# Patient Record
Sex: Male | Born: 1965 | Race: White | Hispanic: No | Marital: Single | Smoking: Current every day smoker
Health system: Southern US, Community
[De-identification: ages and names within clinical notes are randomized; demographics above are authoritative.]

---

## 2018-07-17 ENCOUNTER — Other Ambulatory Visit: Payer: Self-pay

## 2018-07-17 ENCOUNTER — Emergency Department (HOSPITAL_COMMUNITY)
Admission: EM | Admit: 2018-07-17 | Discharge: 2018-07-18 | Disposition: A | Discharge status: Home / Self Care | Payer: Self-pay | Attending: Emergency Medicine | Admitting: Emergency Medicine

## 2018-07-17 DIAGNOSIS — L03115 Cellulitis of right lower limb: Secondary | ICD-10-CM | POA: Insufficient documentation
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
## 2018-07-18 ENCOUNTER — Emergency Department (HOSPITAL_COMMUNITY): Payer: Self-pay

## 2018-07-18 MED ORDER — CEPHALEXIN 250 MG PO CAPS
500.0000 mg | ORAL_CAPSULE | Freq: Once | ORAL | Status: AC
  Administered 2018-07-18: 02:00:00 500 mg via ORAL
  Filled 2018-07-18: qty 2

## 2018-07-18 MED ORDER — CEPHALEXIN 500 MG PO CAPS: 500.0000 mg | ORAL_CAPSULE | Freq: Four times a day (QID) | ORAL | 0 refills | Status: AC

## 2018-07-18 NOTE — ED Notes (Signed)
Patient verbalizes understanding of discharge instructions. Opportunity for questioning and answers were provided. Armband removed by staff, pt discharged from ED.  

## 2018-07-18 NOTE — Discharge Instructions (Signed)
Take the antibiotic as prescribed. Ibuprofen and/or Tylenol for pain as needed.  If redness and pain do not improve after 3 days of antibiotics, return here for a recheck. If symptoms worsen - high fever, severe pain, redness becomes more widespread - return to the emergency department for further evaluation.
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
## 2019-08-15 ENCOUNTER — Encounter (HOSPITAL_COMMUNITY): Payer: Self-pay

## 2019-08-15 ENCOUNTER — Emergency Department (HOSPITAL_COMMUNITY)
Admission: EM | Admit: 2019-08-15 | Discharge: 2019-08-15 | Disposition: A | Discharge status: Home / Self Care | Payer: Self-pay | Attending: Emergency Medicine | Admitting: Emergency Medicine

## 2019-08-15 ENCOUNTER — Other Ambulatory Visit: Payer: Self-pay

## 2019-08-15 DIAGNOSIS — L237 Allergic contact dermatitis due to plants, except food: Secondary | ICD-10-CM | POA: Insufficient documentation

## 2019-08-15 DIAGNOSIS — F17210 Nicotine dependence, cigarettes, uncomplicated: Secondary | ICD-10-CM | POA: Insufficient documentation

## 2019-08-15 MED ORDER — DEXAMETHASONE SODIUM PHOSPHATE 10 MG/ML IJ SOLN
10.0000 mg | Freq: Once | INTRAMUSCULAR | Status: AC
  Administered 2019-08-15: 10 mg via INTRAMUSCULAR
  Filled 2019-08-15: qty 1

## 2019-08-15 MED ORDER — METHYLPREDNISOLONE 4 MG PO TBPK: ORAL_TABLET | ORAL | 0 refills | Status: AC

## 2019-08-15 NOTE — ED Notes (Signed)
Pt called 3 times no answer.

## 2019-08-15 NOTE — ED Triage Notes (Signed)
Pt arrives POV for eval of poison ivy/poison oak onset Saturday. States tried OTC cream for face and arm rash, as well as rash in groin but does not seem to be improving.

## 2019-08-15 NOTE — ED Notes (Signed)
Patient verbalizes understanding of discharge instructions. Opportunity for questioning and answers were provided. Armband removed by staff, pt discharged from ED to home 
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
## 2019-08-15 NOTE — Discharge Instructions (Addendum)
Please read instructions below. Take 25mg  of benadryl every 6 hours for rash and itching. You can take a daytime allergy medicine, such as claritin or zyrtec instead of benadryl. Starting tomorrow, take the medrol dosepak as directed. You can apply a hydrocortisone cream to your rash. Avoid putting this near your eyes. Schedule an appointment with your PCP to follow up on your visit today. Return to the ER immediately for feeling your throat closing, swelling of your lips or tongue, difficulty breathing, or new or concerning symptoms.

## 2020-06-21 ENCOUNTER — Encounter (HOSPITAL_COMMUNITY): Payer: Self-pay

## 2020-06-21 ENCOUNTER — Other Ambulatory Visit: Payer: Self-pay

## 2020-06-21 ENCOUNTER — Emergency Department (HOSPITAL_COMMUNITY): Payer: Self-pay

## 2020-06-21 ENCOUNTER — Emergency Department (HOSPITAL_COMMUNITY)
Admission: EM | Admit: 2020-06-21 | Discharge: 2020-06-22 | Disposition: A | Discharge status: Home / Self Care | Payer: Self-pay | Attending: Emergency Medicine | Admitting: Emergency Medicine

## 2020-06-21 DIAGNOSIS — F17200 Nicotine dependence, unspecified, uncomplicated: Secondary | ICD-10-CM | POA: Insufficient documentation

## 2020-06-21 DIAGNOSIS — R202 Paresthesia of skin: Secondary | ICD-10-CM | POA: Insufficient documentation

## 2020-06-21 DIAGNOSIS — R413 Other amnesia: Secondary | ICD-10-CM | POA: Insufficient documentation

## 2020-06-21 DIAGNOSIS — R030 Elevated blood-pressure reading, without diagnosis of hypertension: Secondary | ICD-10-CM

## 2020-06-21 DIAGNOSIS — E876 Hypokalemia: Secondary | ICD-10-CM

## 2020-06-21 LAB — CBC
HCT: 47.6 % (ref 39.0–52.0)
Hemoglobin: 15.9 g/dL (ref 13.0–17.0)
MCH: 30.6 pg (ref 26.0–34.0)
MCHC: 33.4 g/dL (ref 30.0–36.0)
MCV: 91.5 fL (ref 80.0–100.0)
Platelets: 327 10*3/uL (ref 150–400)
RBC: 5.2 MIL/uL (ref 4.22–5.81)
RDW: 12.9 % (ref 11.5–15.5)
WBC: 12.3 10*3/uL — ABNORMAL HIGH (ref 4.0–10.5)
nRBC: 0 % (ref 0.0–0.2)

## 2020-06-21 LAB — I-STAT CHEM 8, ED
BUN: 13 mg/dL (ref 6–20)
Calcium, Ion: 1.15 mmol/L (ref 1.15–1.40)
Chloride: 104 mmol/L (ref 98–111)
Creatinine, Ser: 0.9 mg/dL (ref 0.61–1.24)
Glucose, Bld: 81 mg/dL (ref 70–99)
HCT: 47 % (ref 39.0–52.0)
Hemoglobin: 16 g/dL (ref 13.0–17.0)
Potassium: 3.2 mmol/L — ABNORMAL LOW (ref 3.5–5.1)
Sodium: 140 mmol/L (ref 135–145)
TCO2: 26 mmol/L (ref 22–32)

## 2020-06-21 LAB — DIFFERENTIAL
Abs Immature Granulocytes: 0.06 10*3/uL (ref 0.00–0.07)
Basophils Absolute: 0.1 10*3/uL (ref 0.0–0.1)
Basophils Relative: 1 %
Eosinophils Absolute: 0.2 10*3/uL (ref 0.0–0.5)
Eosinophils Relative: 1 %
Immature Granulocytes: 1 %
Lymphocytes Relative: 17 %
Lymphs Abs: 2.1 10*3/uL (ref 0.7–4.0)
Monocytes Absolute: 0.9 10*3/uL (ref 0.1–1.0)
Monocytes Relative: 7 %
Neutro Abs: 9.1 10*3/uL — ABNORMAL HIGH (ref 1.7–7.7)
Neutrophils Relative %: 73 %

## 2020-06-21 LAB — ETHANOL: Alcohol, Ethyl (B): <10 mg/dL (ref <10)

## 2020-06-21 LAB — COMPREHENSIVE METABOLIC PANEL
ALT: 21 U/L (ref 0–44)
AST: 28 U/L (ref 15–41)
Albumin: 4.3 g/dL (ref 3.5–5.0)
Alkaline Phosphatase: 75 U/L (ref 38–126)
Anion gap: 10 (ref 5–15)
BUN: 12 mg/dL (ref 6–20)
CO2: 25 mmol/L (ref 22–32)
Calcium: 9.1 mg/dL (ref 8.9–10.3)
Chloride: 102 mmol/L (ref 98–111)
Creatinine, Ser: 0.99 mg/dL (ref 0.61–1.24)
GFR, Estimated: >60 mL/min (ref >60)
Glucose, Bld: 85 mg/dL (ref 70–99)
Potassium: 3.2 mmol/L — ABNORMAL LOW (ref 3.5–5.1)
Sodium: 137 mmol/L (ref 135–145)
Total Bilirubin: 0.8 mg/dL (ref 0.3–1.2)
Total Protein: 7 g/dL (ref 6.5–8.1)

## 2020-06-21 LAB — PROTIME-INR
INR: 1 (ref 0.8–1.2)
Prothrombin Time: 13.3 seconds (ref 11.4–15.2)

## 2020-06-21 LAB — APTT: aPTT: 29 seconds (ref 24–36)

## 2020-06-21 MED ORDER — POTASSIUM CHLORIDE CRYS ER 20 MEQ PO TBCR
40.0000 meq | EXTENDED_RELEASE_TABLET | Freq: Once | ORAL | Status: AC
  Administered 2020-06-22: 40 meq via ORAL
  Filled 2020-06-21: qty 2

## 2020-06-21 NOTE — ED Triage Notes (Signed)
Emergency Medicine Provider Triage Evaluation Note  Matthew Dunlap , a 55 y.o. male  was evaluated in triage.  Pt complains of memory loss, numbness  Review of Systems  Positive: As above Negative: Changes, facial droop, word slurring, headache  Physical Exam  BP (!) 171/109 (BP Location: Right Arm)   Pulse (!) 109   Temp 98.9 F (37.2 C) (Oral)   Resp 16   Ht 5\' 5"  (1.651 m)   Wt 72.6 kg   SpO2 100%   BMI 26.63 kg/m  Gen:   Awake, no distress   HEENT:  Atraumatic  Resp:  Normal effort  Cardiac:  Normal rate  Abd:   Nondistended, nontender  MSK:   Moves extremities without difficulty  Neuro:  Mental Status:  Alert, thought content appropriate, able to give a coherent history. Speech fluent without evidence of aphasia. Able to follow 2 step commands without difficulty.  Cranial Nerves:  II:  Peripheral visual fields grossly normal, pupils equal, round, reactive to light III,IV, VI: ptosis not present, extra-ocular motions intact bilaterally  V,VII: smile symmetric, facial light touch sensation equal VIII: hearing grossly normal to voice  X: uvula elevates symmetrically  XI: bilateral shoulder shrug symmetric and strong XII: midline tongue extension without fassiculations Motor:  Normal tone. 5/5 strength of BUE and BLE major muscle groups including strong and equal grip strength and dorsiflexion/plantar flexion. Sensory:  Reported numbness/tingling on the medial aspect of the left arm and leg. Normal in other extremities. Cerebellar: normal finger-to-nose with bilateral upper extremities, Romberg sign absent Gait: normal gait and balance. Able to walk on toes and heels with ease.    Medical Decision Making  Medically screening exam initiated at 9:14 PM.  Appropriate orders placed.  Matthew Dunlap was informed that the remainder of the evaluation will be completed by another provider, this initial triage assessment does not replace that evaluation, and the importance of  remaining in the ED until their evaluation is complete.  Clinical Impression  55 year old male with a loss of about 5 PM to 8 PM.  Also reporting numbness appears to be dermatomal in distribution.  No other focal neurologic deficits noted.  Patient was also evaluated by Dr. 53.  Do not think a code stroke is indicated at this time, however stroke labs will be ordered.  Stable for further evaluation.   Matthew Bath, PA-C 06/21/20 2155

## 2020-06-21 NOTE — ED Triage Notes (Signed)
Patient reports he does not remember anything from 5pm-8pm, states he does not remember working, also reporting some L sided numbness.

## 2020-06-21 NOTE — Discharge Instructions (Addendum)
It was our pleasure to provide your ER care today - we hope that you feel better.  Follow up with neurologist in the coming week - call office this AM to arrange appointment.   Your blood pressure is high - limit salt intake, eat heart healthy diet, and follow up with primary care doctor in the coming week.   From today's lab tests, your potassium level is mildly low (3.2) - eat plenty of fruits and vegetables, and follow up with primary care doctor in one week.   Return to ER if worse, new symptoms, change in speech or vision, one-sided numbness or weakness, or other concern.

## 2020-06-21 NOTE — ED Provider Notes (Signed)
[facility] EMERGENCY DEPARTMENT Provider Note   CSN: [id number] Arrival date & time: 06/21/20  2053     History Chief Complaint  Patient presents with  . Memory Loss  . Numbness    Matthew Dunlap is a 55 y.o. male.  Patient c/o losing his memory for three hours earlier this evening, and feeling as if had some left arm numbness. Symptoms acute onset, moderate, constant, persistent.  States he was working today per normal, but then was sitting in his vehicle around 8 pm, and couldn't remember what he had done for the previous three hours.  States he could remember what he had done earlier in day/afternoon at work, but doesn't recall that three hour period of time. Denies any trauma or fall. Denies syncope or falling asleep. No hx seizures. No headache. No neck or back pain. No chest pain or discomfort. No sob or unusual doe. No abd pain or nvd. No dysuria, incontinence or gu c/o. No extremity pain, swelling or injury. No rash. No change in meds. No etoh or substance abuse.  States similar thing happened a few years ago, but no specific cause noted. Denies unusual stressors, anxiety or depression.   The history is provided by the patient.       History reviewed. No pertinent past medical history.  There are no problems to display for this patient.   History reviewed. No pertinent surgical history.     History reviewed. No pertinent family history.  Social History   Tobacco Use  . Smoking status: Current Every Day Smoker    Packs/day: 2.00  . Smokeless tobacco: Never Used  Substance Use Topics  . Alcohol use: Not Currently  . Drug use: Not Currently    Home Medications Prior to Admission medications   Medication Sig Start Date End Date Taking? Authorizing Provider  alprazolam Prudy Feeler) 2 MG tablet Take 1 mg by mouth at bedtime as needed for sleep or anxiety.   Yes [provider]  amphetamine-dextroamphetamine (ADDERALL) 20 MG tablet Take 20 mg  by mouth 3 (three) times daily.   Yes [provider]  Ascorbic Acid (VITAMIN C) 1000 MG tablet Take 1,000 mg by mouth daily.   Yes [provider]  Cyanocobalamin (VITAMIN B 12 PO) Take 1 tablet by mouth daily.   Yes [provider]  ibuprofen (ADVIL) 200 MG tablet Take 400 mg by mouth every 6 (six) hours as needed for moderate pain.   Yes [provider]  zinc gluconate 50 MG tablet Take 50 mg by mouth daily.   Yes [provider]  methylPREDNISolone (MEDROL DOSEPAK) 4 MG TBPK tablet Please take as directed on box. Patient not taking: No sig reported 08/15/19   Robinson, [place] N, PA-C    Allergies    Patient has no known allergies.  Review of Systems   Review of Systems  Constitutional: Negative for chills and fever.  HENT: Negative for sore throat and trouble swallowing.   Eyes: Negative for redness and visual disturbance.  Respiratory: Negative for cough and shortness of breath.   Cardiovascular: Negative for chest pain, palpitations and leg swelling.  Gastrointestinal: Negative for abdominal pain, blood in stool, diarrhea and vomiting.  Genitourinary: Negative for dysuria and flank pain.  Musculoskeletal: Negative for back pain and neck pain.  Skin: Negative for rash.  Neurological: Negative for syncope, speech difficulty, weakness, numbness and headaches.  Hematological: Does not bruise/bleed easily.  Psychiatric/Behavioral: Negative for confusion. The patient is not  nervous/anxious.     Physical Exam Updated Vital Signs BP (!) 157/104   Pulse (!) 102   Temp 98.9 F (37.2 C) (Oral)   Resp (!) 29   Ht 1.651 m (5\' 5" )   Wt 72.6 kg   SpO2 100%   BMI 26.63 kg/m   Physical Exam Vitals and nursing note reviewed.  Constitutional:      Appearance: Normal appearance. He is well-developed.  HENT:     Head: Atraumatic.     Nose: Nose normal.     Mouth/Throat:     Mouth: Mucous membranes are moist.     Pharynx: Oropharynx is  clear.     Comments: No oral/tongue trauma.  Eyes:     General: No scleral icterus.    Extraocular Movements: Extraocular movements intact.     Conjunctiva/sclera: Conjunctivae normal.     Pupils: Pupils are equal, round, and reactive to light.  Neck:     Vascular: No carotid bruit.     Trachea: No tracheal deviation.     Comments: No stiffness or rigidity.  Cardiovascular:     Rate and Rhythm: Normal rate and regular rhythm.     Pulses: Normal pulses.     Heart sounds: Normal heart sounds. No murmur heard. No friction rub. No gallop.   Pulmonary:     Effort: Pulmonary effort is normal. No accessory muscle usage or respiratory distress.     Breath sounds: Normal breath sounds.  Chest:     Chest wall: No tenderness.  Abdominal:     General: Bowel sounds are normal. There is no distension.     Palpations: Abdomen is soft. There is no mass.     Tenderness: There is no abdominal tenderness. There is no guarding or rebound.     Hernia: No hernia is present.  Genitourinary:    Comments: No cva tenderness. Musculoskeletal:        General: No swelling.     Cervical back: Normal range of motion and neck supple. No rigidity or tenderness.     Comments: CTLS spine, non tender, aligned, no step off. Good rom bil extremities, no pain or focal bony tenderness.   Skin:    General: Skin is warm and dry.     Findings: No rash.  Neurological:     General: No focal deficit present.     Mental Status: He is alert and oriented to person, place, and time.     Cranial Nerves: No cranial nerve deficit.     Comments: Alert, speech clear. Oriented. No dysarthria or aphasia. Motor intact bil, stre 5/5. No pronator drift. Sens grossly intact bil. Steady gait. No ataxia.   Psychiatric:        Mood and Affect: Mood normal.     ED Results / Procedures / Treatments   Labs (all labs ordered are listed, but only abnormal results are displayed) Results for orders placed or performed during the hospital  encounter of 06/21/20  Ethanol  Result Value Ref Range   Alcohol, Ethyl (B) <10 <10 mg/dL  Protime-INR  Result Value Ref Range   Prothrombin Time 13.3 11.4 - 15.2 seconds   INR 1.0 0.8 - 1.2  APTT  Result Value Ref Range   aPTT 29 24 - 36 seconds  CBC  Result Value Ref Range   WBC 12.3 (H) 4.0 - 10.5 K/uL   RBC 5.20 4.22 - 5.81 MIL/uL   Hemoglobin 15.9 13.0 - 17.0 g/dL   HCT 16.147.6 09.639.0 -  52.0 %   MCV 91.5 80.0 - 100.0 fL   MCH 30.6 26.0 - 34.0 pg   MCHC 33.4 30.0 - 36.0 g/dL   RDW 67.3 41.9 - 37.9 %   Platelets 327 150 - 400 K/uL   nRBC 0.0 0.0 - 0.2 %  Differential  Result Value Ref Range   Neutrophils Relative % 73 %   Neutro Abs 9.1 (H) 1.7 - 7.7 K/uL   Lymphocytes Relative 17 %   Lymphs Abs 2.1 0.7 - 4.0 K/uL   Monocytes Relative 7 %   Monocytes Absolute 0.9 0.1 - 1.0 K/uL   Eosinophils Relative 1 %   Eosinophils Absolute 0.2 0.0 - 0.5 K/uL   Basophils Relative 1 %   Basophils Absolute 0.1 0.0 - 0.1 K/uL   Immature Granulocytes 1 %   Abs Immature Granulocytes 0.06 0.00 - 0.07 K/uL  Comprehensive metabolic panel  Result Value Ref Range   Sodium 137 135 - 145 mmol/L   Potassium 3.2 (L) 3.5 - 5.1 mmol/L   Chloride 102 98 - 111 mmol/L   CO2 25 22 - 32 mmol/L   Glucose, Bld 85 70 - 99 mg/dL   BUN 12 6 - 20 mg/dL   Creatinine, Ser 0.24 0.61 - 1.24 mg/dL   Calcium 9.1 8.9 - 09.7 mg/dL   Total Protein 7.0 6.5 - 8.1 g/dL   Albumin 4.3 3.5 - 5.0 g/dL   AST 28 15 - 41 U/L   ALT 21 0 - 44 U/L   Alkaline Phosphatase 75 38 - 126 U/L   Total Bilirubin 0.8 0.3 - 1.2 mg/dL   GFR, Estimated >35 >32 mL/min   Anion gap 10 5 - 15  I-stat chem 8, ED  Result Value Ref Range   Sodium 140 135 - 145 mmol/L   Potassium 3.2 (L) 3.5 - 5.1 mmol/L   Chloride 104 98 - 111 mmol/L   BUN 13 6 - 20 mg/dL   Creatinine, Ser 9.92 0.61 - 1.24 mg/dL   Glucose, Bld 81 70 - 99 mg/dL   Calcium, Ion 4.26 8.34 - 1.40 mmol/L   TCO2 26 22 - 32 mmol/L   Hemoglobin 16.0 13.0 - 17.0 g/dL   HCT 19.6  22.2 - 97.9 %   CT HEAD WO CONTRAST  Result Date: 06/21/2020 CLINICAL DATA:  Amnesia from 5 p.m. to 8 p.m., left-sided numbness EXAM: CT HEAD WITHOUT CONTRAST TECHNIQUE: Contiguous axial images were obtained from the base of the skull through the vertex without intravenous contrast. COMPARISON:  None. FINDINGS: Brain: No acute infarct or hemorrhage. Lateral ventricles and midline structures are unremarkable. No acute extra-axial fluid collections. No mass effect. Vascular: No hyperdense vessel or unexpected calcification. Skull: Normal. Negative for fracture or focal lesion. Sinuses/Orbits: Mild diffuse mucosal thickening throughout the frontal, ethmoid, and maxillary sinuses. Other: None. IMPRESSION: 1. No acute intracranial process. 2. Mucosal thickening throughout the frontal, ethmoid, and maxillary sinuses. Electronically Signed   By: Sharlet Salina M.D.   On: 06/21/2020 21:43    EKG EKG Interpretation  Date/Time:  Thursday June 21 2020 21:48:52 EDT Ventricular Rate:  96 PR Interval:  159 QRS Duration: 101 QT Interval:  346 QTC Calculation: 438 R Axis:   224 Text Interpretation: Sinus rhythm No previous tracing Confirmed by Cathren Laine ([id number]) on 06/21/2020 9:53:06 PM   Radiology CT HEAD WO CONTRAST  Result Date: 06/21/2020 CLINICAL DATA:  Amnesia from 5 p.m. to 8 p.m., left-sided numbness EXAM: CT HEAD WITHOUT CONTRAST TECHNIQUE: Contiguous  axial images were obtained from the base of the skull through the vertex without intravenous contrast. COMPARISON:  None. FINDINGS: Brain: No acute infarct or hemorrhage. Lateral ventricles and midline structures are unremarkable. No acute extra-axial fluid collections. No mass effect. Vascular: No hyperdense vessel or unexpected calcification. Skull: Normal. Negative for fracture or focal lesion. Sinuses/Orbits: Mild diffuse mucosal thickening throughout the frontal, ethmoid, and maxillary sinuses. Other: None. IMPRESSION: 1. No acute intracranial  process. 2. Mucosal thickening throughout the frontal, ethmoid, and maxillary sinuses. Electronically Signed   By: Sharlet Salina M.D.   On: 06/21/2020 21:43    Procedures Procedures   Medications Ordered in ED Medications - No data to display  ED Course  I have reviewed the triage vital signs and the nursing notes.  Pertinent labs & imaging results that were available during my care of the patient were reviewed by me and considered in my medical decision making (see chart for details).    MDM Rules/Calculators/A&P                          Iv ns. Continuous pulse ox and cardiac monitoring. Stat labs and imaging. Ecg.   Reviewed nursing notes and prior charts for additional history.   Labs reviewed/interpreted by me - glucose normal. k sl low. kcl po.   CT reviewed/interpreted by me - no hem.  Consulted with neurologist/Dr Selina Cooley - recommend mri r/o cva.   MRI pending.   Signed out to Dr Bebe Shaggy to f/u on mri.  If mri normal, feel likely stable for d/c with close outpatient neurology f/u.  If abnormal/positive, admit.      Final Clinical Impression(s) / ED Diagnoses Final diagnoses:  None    Rx / DC Orders ED Discharge Orders    None       Cathren Laine, MD 06/21/20 2313

## 2020-06-21 NOTE — ED Notes (Signed)
Patient verbalized MSE evaluation, unable to sign due to signature pad not working.

## 2020-06-21 NOTE — ED Notes (Signed)
Patient transported to MRI 

## 2020-06-21 NOTE — ED Notes (Signed)
Patient transported to CT 

## 2020-06-22 LAB — URINALYSIS, ROUTINE W REFLEX MICROSCOPIC
Bilirubin Urine: NEGATIVE
Glucose, UA: NEGATIVE mg/dL
Hgb urine dipstick: NEGATIVE
Ketones, ur: 5 mg/dL — AB
Leukocytes,Ua: NEGATIVE
Nitrite: NEGATIVE
Protein, ur: NEGATIVE mg/dL
Specific Gravity, Urine: 1.015 (ref 1.005–1.030)
pH: 6 (ref 5.0–8.0)

## 2020-06-22 LAB — RAPID URINE DRUG SCREEN, HOSP PERFORMED
Amphetamines: POSITIVE — AB
Barbiturates: NOT DETECTED
Benzodiazepines: POSITIVE — AB
Cocaine: NOT DETECTED
Opiates: NOT DETECTED
Tetrahydrocannabinol: NOT DETECTED

## 2020-06-22 NOTE — ED Provider Notes (Signed)
Patient feels improved.  He is ambulatory.  He denies any other complaints.  He is requesting discharge home because he has to work in 4 hours.  Will give referral to neurology   Zadie Rhine, MD 06/22/20 [phone]

## 2020-06-22 NOTE — ED Notes (Signed)
Pt back from MRI 

## 2020-06-22 NOTE — ED Notes (Signed)
NT ambulated pt. Pt had steady gait and no complaints.

## 2021-10-31 IMAGING — MR MR HEAD W/O CM
12 of 13 series · 44 of 48 positions shown · non-contrast
Comparison: None.

CLINICAL DATA: Memory loss, acute.  Left arm numbness.

EXAM:
MRI HEAD WITHOUT CONTRAST
TECHNIQUE: Multiplanar, multiecho pulse sequences of the brain and surrounding
structures were obtained without intravenous contrast.

[Series 5: DWI · axial · 3.0mm · 0.88mm/px · z∈[-86,+60]mm · 8 of 104 slices shown (1 of 4)]
[im 1/104]
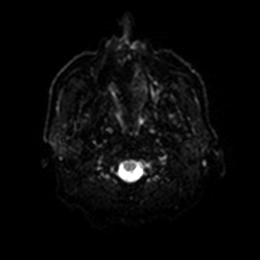
[im 15/104]
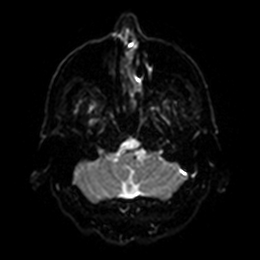
[im 30/104]
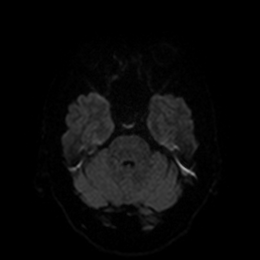
[im 45/104]
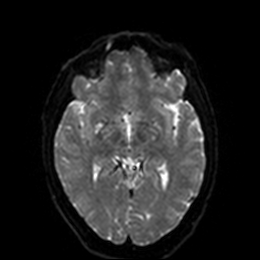
[im 59/104]
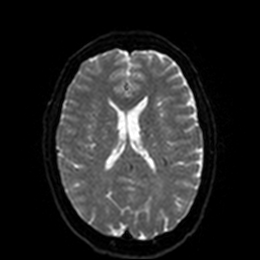
[im 74/104]
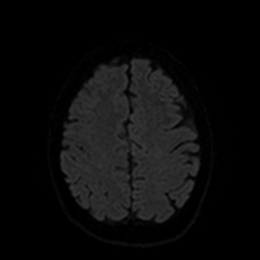
[im 89/104]
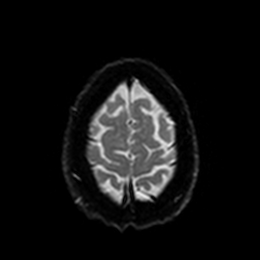
[im 104/104]
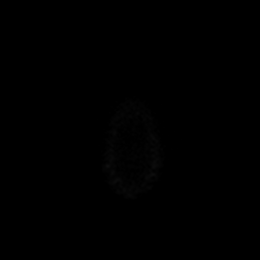

[Series 6: DWI · axial · 3.0mm · 0.88mm/px · z∈[-86,+60]mm · 4 of 52 slices shown (2 of 4)]
[im 1/52]
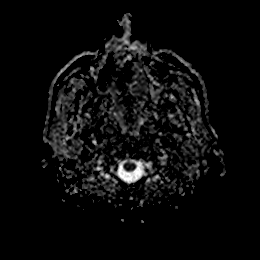
[im 18/52]
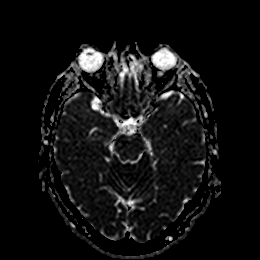
[im 35/52]
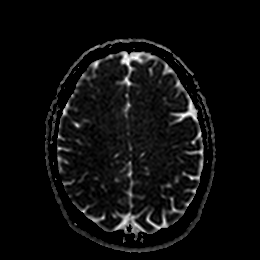
[im 52/52]
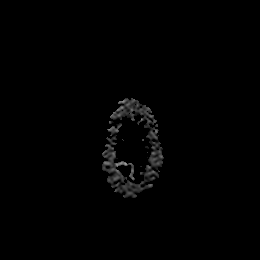

[Series 7: DWI · coronal · 4.0mm · 0.88mm/px · 6 of 72 slices shown (3 of 4)]
[im 1/72]
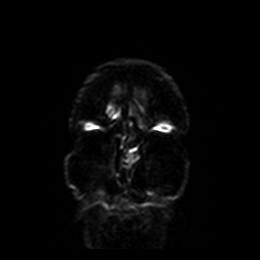
[im 15/72]
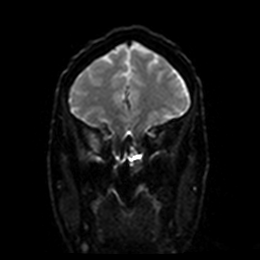
[im 29/72]
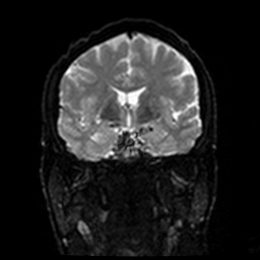
[im 43/72]
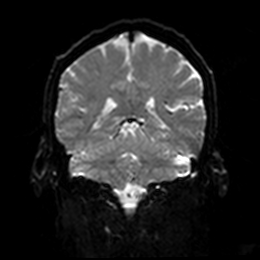
[im 57/72]
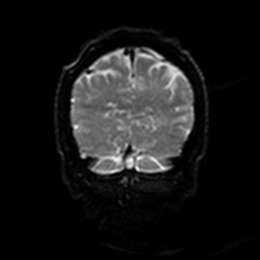
[im 72/72]
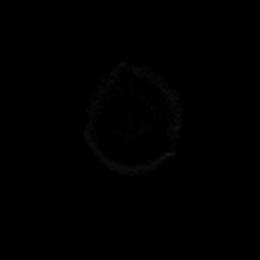

[Series 8: DWI · coronal · 4.0mm · 0.88mm/px · 3 of 36 slices shown (4 of 4)]
[im 1/36]
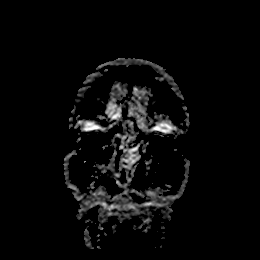
[im 18/36]
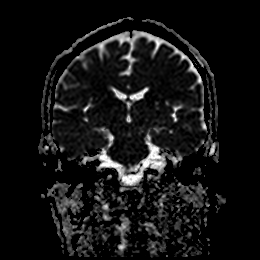
[im 36/36]
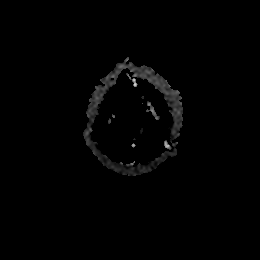

[Series 9: T1 · sagittal · 5.0mm · 0.75mm/px · 2 of 25 slices shown]
[im 1/25]
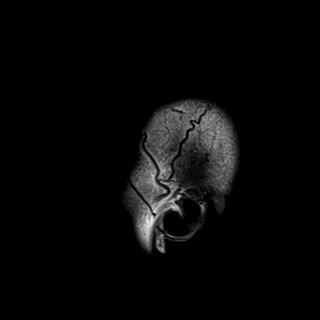
[im 25/25]
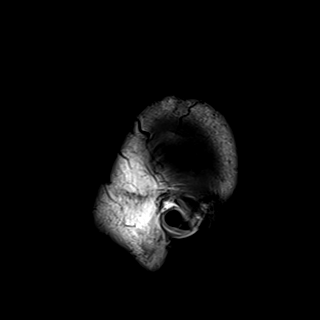

[Series 10: T2 · axial · 5.0mm · 0.72mm/px · z∈[-88,+61]mm · 2 of 27 slices shown (1 of 2)]
[im 1/27]
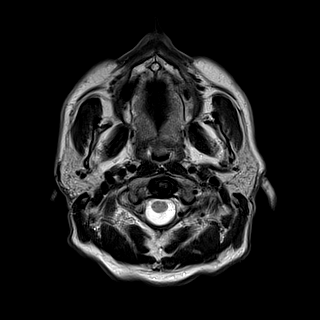
[im 27/27]
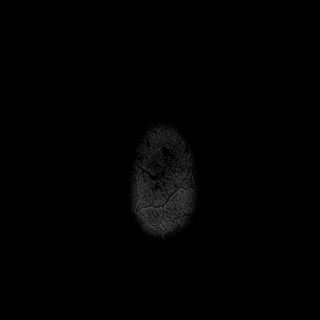

[Series 11: FLAIR · axial · 5.0mm · 0.45mm/px · z∈[-90,+59]mm · 2 of 27 slices shown]
[im 1/27]
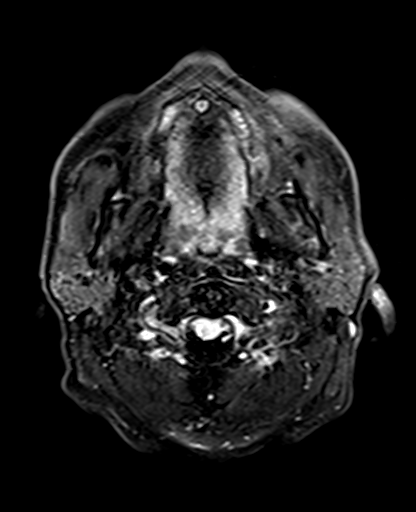
[im 27/27]
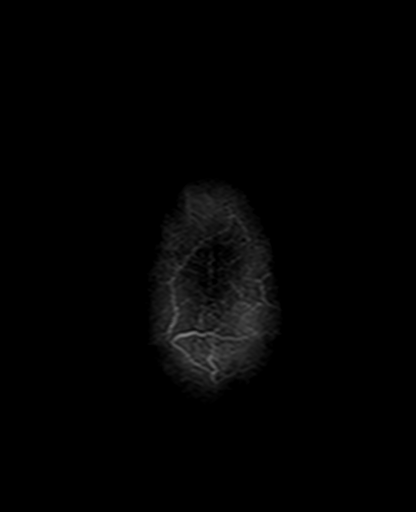

[Series 12: mag_images · axial · 3.0mm · 0.90mm/px · z∈[-88,+57]mm · 4 of 52 slices shown]
[im 1/52]
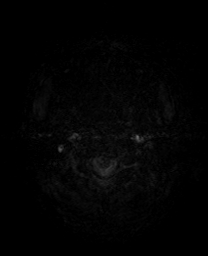
[im 18/52]
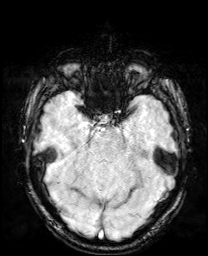
[im 35/52]
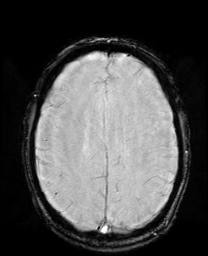
[im 52/52]
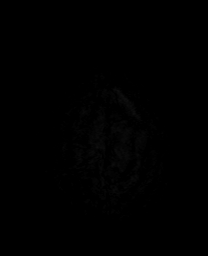

[Series 13: pha_images · axial · 3.0mm · 0.90mm/px · z∈[-86,+55]mm · 4 of 50 slices shown]
[im 1/50]
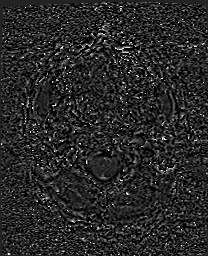
[im 17/50]
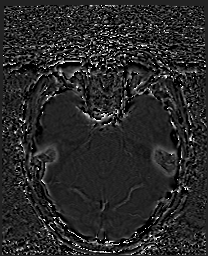
[im 33/50]
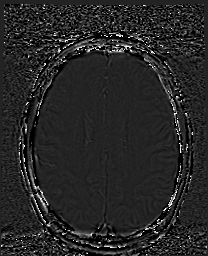
[im 50/50]
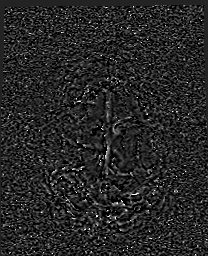

[Series 14: swi_images · axial · 3.0mm · 0.90mm/px · z∈[-88,+57]mm · 4 of 52 slices shown]
[im 1/52]
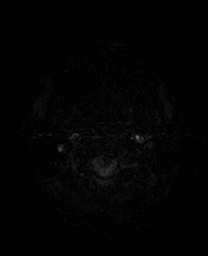
[im 18/52]
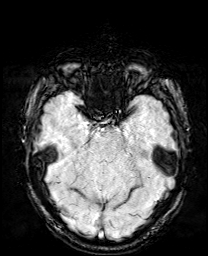
[im 35/52]
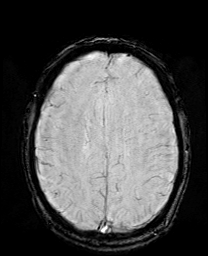
[im 52/52]
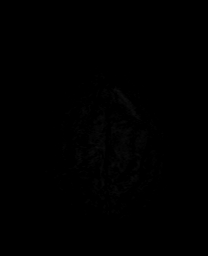

[Series 15: mip_images(sw) · axial · 24.0mm · 0.90mm/px · z∈[-78,+47]mm · 3 of 45 slices shown]
[im 1/45]
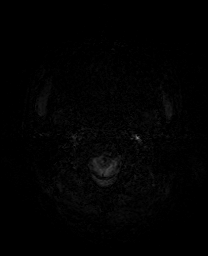
[im 23/45]
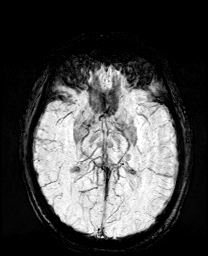
[im 45/45]
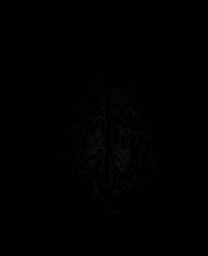

[Series 17: T2 · coronal · 5.0mm · 0.34mm/px · 2 of 29 slices shown (2 of 2)]
[im 1/29]
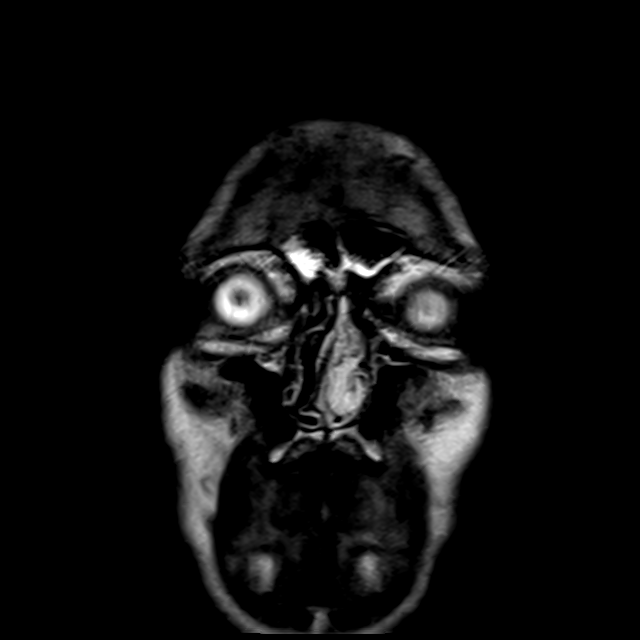
[im 29/29]
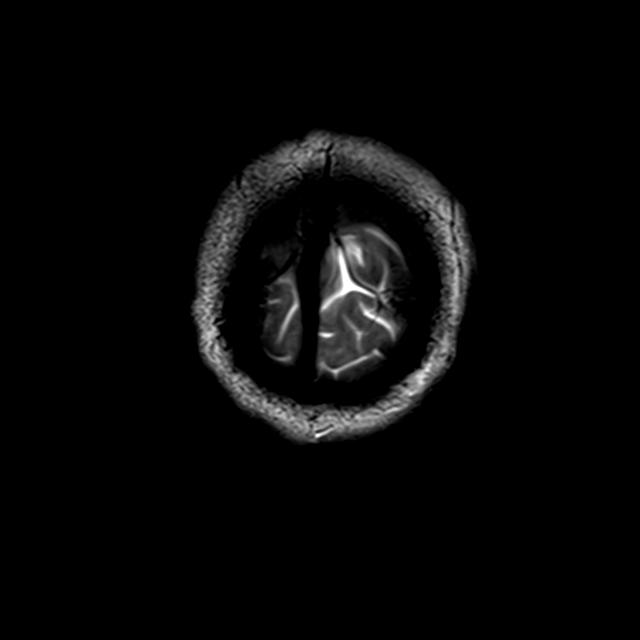

[44 of 48 positions shown; findings below may reference images not displayed]

FINDINGS: Brain: No acute infarct, mass effect or extra-axial collection. No
acute or chronic hemorrhage. Normal white matter signal, parenchymal
volume and CSF spaces. The midline structures are normal.

Vascular: Major flow voids are preserved.

Skull and upper cervical spine: Normal calvarium and skull base.
Visualized upper cervical spine and soft tissues are normal.

Sinuses/Orbits:Mild ethmoid sinus mucosal thickening. No mastoid
effusion. Normal orbits.
IMPRESSION: Normal MRI of the brain.
